# Patient Record
Sex: Male | Born: 1960
Health system: Southern US, Community
[De-identification: ages and names within clinical notes are randomized; demographics above are authoritative.]

## PROBLEM LIST (undated history)

## (undated) DIAGNOSIS — E7800 Pure hypercholesterolemia, unspecified: Secondary | ICD-10-CM

---

## 2015-04-14 ENCOUNTER — Emergency Department
Admission: EM | Admit: 2015-04-14 | Discharge: 2015-04-14 | Disposition: A | Discharge status: Home / Self Care | Payer: Managed Care, Other (non HMO) | Attending: Emergency Medicine | Admitting: Emergency Medicine

## 2015-04-14 ENCOUNTER — Encounter: Payer: Self-pay | Admitting: Emergency Medicine

## 2015-04-14 ENCOUNTER — Emergency Department: Payer: Managed Care, Other (non HMO)

## 2015-04-14 DIAGNOSIS — IMO0002 Reserved for concepts with insufficient information to code with codable children: Secondary | ICD-10-CM

## 2015-04-14 DIAGNOSIS — Z87891 Personal history of nicotine dependence: Secondary | ICD-10-CM | POA: Diagnosis not present

## 2015-04-14 DIAGNOSIS — W540XXA Bitten by dog, initial encounter: Secondary | ICD-10-CM | POA: Diagnosis not present

## 2015-04-14 DIAGNOSIS — Y929 Unspecified place or not applicable: Secondary | ICD-10-CM | POA: Insufficient documentation

## 2015-04-14 DIAGNOSIS — S61011A Laceration without foreign body of right thumb without damage to nail, initial encounter: Secondary | ICD-10-CM | POA: Insufficient documentation

## 2015-04-14 DIAGNOSIS — Y999 Unspecified external cause status: Secondary | ICD-10-CM | POA: Diagnosis not present

## 2015-04-14 DIAGNOSIS — Z23 Encounter for immunization: Secondary | ICD-10-CM | POA: Diagnosis not present

## 2015-04-14 DIAGNOSIS — Y9301 Activity, walking, marching and hiking: Secondary | ICD-10-CM | POA: Diagnosis not present

## 2015-04-14 DIAGNOSIS — S61051A Open bite of right thumb without damage to nail, initial encounter: Secondary | ICD-10-CM | POA: Diagnosis present

## 2015-04-14 DIAGNOSIS — S61411A Laceration without foreign body of right hand, initial encounter: Secondary | ICD-10-CM | POA: Insufficient documentation

## 2015-04-14 MED ORDER — LIDOCAINE-EPINEPHRINE 2 %-1:100000 IJ SOLN: 20.0000 mL | Freq: Once | INTRAMUSCULAR | Status: DC

## 2015-04-14 MED ORDER — LIDOCAINE-EPINEPHRINE (PF) 2 %-1:200000 IJ SOLN: 10.0000 mL | Freq: Once | INTRAMUSCULAR | Status: DC

## 2015-04-14 MED ORDER — OXYCODONE-ACETAMINOPHEN 5-325 MG PO TABS: 2.0000 | ORAL_TABLET | Freq: Four times a day (QID) | ORAL | Status: AC | PRN

## 2015-04-14 MED ORDER — CEPHALEXIN 500 MG PO CAPS
500.0000 mg | ORAL_CAPSULE | Freq: Four times a day (QID) | ORAL | Status: AC
Start: 2015-04-14 — End: 2015-04-24

## 2015-04-14 MED ORDER — TETANUS-DIPHTH-ACELL PERTUSSIS 5-2.5-18.5 LF-MCG/0.5 IM SUSP
0.5000 mL | Freq: Once | INTRAMUSCULAR | Status: AC
  Administered 2015-04-14: 0.5 mL via INTRAMUSCULAR
  Filled 2015-04-14: qty 0.5

## 2015-04-14 MED ORDER — OXYCODONE-ACETAMINOPHEN 5-325 MG PO TABS
ORAL_TABLET | ORAL | Status: AC
  Filled 2015-04-14: qty 2

## 2015-04-14 MED ORDER — OXYCODONE-ACETAMINOPHEN 5-325 MG PO TABS
2.0000 | ORAL_TABLET | Freq: Once | ORAL | Status: AC
  Administered 2015-04-14: 2 via ORAL

## 2015-04-14 NOTE — ED Notes (Signed)
MD at bedside. 

## 2015-04-14 NOTE — ED Notes (Signed)
Bit by neighbors dog on rt hand - vasovagaled once home and wife called 35. Pt feels fine now. Dogs up to date on shots.

## 2015-04-14 NOTE — Discharge Instructions (Signed)
Laceration Care, Adult A laceration is a cut that goes through all of the layers of the skin and into the tissue that is right under the skin. Some lacerations heal on their own. Others need to be closed with stitches (sutures), staples, skin adhesive strips, or skin glue. Proper laceration care minimizes the risk of infection and helps the laceration to heal better. HOW TO CARE FOR YOUR LACERATION If sutures or staples were used:  Keep the wound clean and dry.  If you were given a bandage (dressing), you should change it at least one time per day or as told by your health care provider. You should also change it if it becomes wet or dirty.  Keep the wound completely dry for the first 24 hours or as told by your health care provider. After that time, you may shower or bathe. However, make sure that the wound is not soaked in water until after the sutures or staples have been removed.  Clean the wound one time each day or as told by your health care provider:  Wash the wound with soap and water.  Rinse the wound with water to remove all soap.  Pat the wound dry with a clean towel. Do not rub the wound.  After cleaning the wound, apply a thin layer of antibiotic ointmentas told by your health care provider. This will help to prevent infection and keep the dressing from sticking to the wound.  Have the sutures or staples removed as told by your health care provider. If skin adhesive strips were used:  Keep the wound clean and dry.  If you were given a bandage (dressing), you should change it at least one time per day or as told by your health care provider. You should also change it if it becomes dirty or wet.  Do not get the skin adhesive strips wet. You may shower or bathe, but be careful to keep the wound dry.  If the wound gets wet, pat it dry with a clean towel. Do not rub the wound.  Skin adhesive strips fall off on their own. You may trim the strips as the wound heals. Do not  remove skin adhesive strips that are still stuck to the wound. They will fall off in time. If skin glue was used:  Try to keep the wound dry, but you may briefly wet it in the shower or bath. Do not soak the wound in water, such as by swimming.  After you have showered or bathed, gently pat the wound dry with a clean towel. Do not rub the wound.  Do not do any activities that will make you sweat heavily until the skin glue has fallen off on its own.  Do not apply liquid, cream, or ointment medicine to the wound while the skin glue is in place. Using those may loosen the film before the wound has healed.  If you were given a bandage (dressing), you should change it at least one time per day or as told by your health care provider. You should also change it if it becomes dirty or wet.  If a dressing is placed over the wound, be careful not to apply tape directly over the skin glue. Doing that may cause the glue to be pulled off before the wound has healed.  Do not pick at the glue. The skin glue usually remains in place for 5-10 days, then it falls off of the skin. General Instructions  Take over-the-counter and prescription  medicines only as told by your health care provider.  If you were prescribed an antibiotic medicine or ointment, take or apply it as told by your doctor. Do not stop using it even if your condition improves.  To help prevent scarring, make sure to cover your wound with sunscreen whenever you are outside after stitches are removed, after adhesive strips are removed, or when glue remains in place and the wound is healed. Make sure to wear a sunscreen of at least 30 SPF.  Do not scratch or pick at the wound.  Keep all follow-up visits as told by your health care provider. This is important.  Check your wound every day for signs of infection. Watch for:  Redness, swelling, or pain.  Fluid, blood, or pus.  Raise (elevate) the injured area above the level of your heart  while you are sitting or lying down, if possible. SEEK MEDICAL CARE IF:  You received a tetanus shot and you have swelling, severe pain, redness, or bleeding at the injection site.  You have a fever.  A wound that was closed breaks open.  You notice a bad smell coming from your wound or your dressing.  You notice something coming out of the wound, such as wood or glass.  Your pain is not controlled with medicine.  You have increased redness, swelling, or pain at the site of your wound.  You have fluid, blood, or pus coming from your wound.  You notice a change in the color of your skin near your wound.  You need to change the dressing frequently due to fluid, blood, or pus draining from the wound.  You develop a new rash.  You develop numbness around the wound. SEEK IMMEDIATE MEDICAL CARE IF:  You develop severe swelling around the wound.  Your pain suddenly increases and is severe.  You develop painful lumps near the wound or on skin that is anywhere on your body.  You have a red streak going away from your wound.  The wound is on your hand or foot and you cannot properly move a finger or toe.  The wound is on your hand or foot and you notice that your fingers or toes look pale or bluish.   This information is not intended to replace advice given to you by your health care provider. Make sure you discuss any questions you have with your health care provider.   Document Released: 12/22/2004 Document Revised: 05/08/2014 Document Reviewed: 12/18/2013 Elsevier Interactive Patient Education 2016 [employer]. Conservator, museum/gallery bites can range from mild to serious. An animal bite can result in a scratch on the skin, a deep open cut, a puncture of the skin, a crush injury, or tearing away of the skin or a body part. A small bite from a house pet will usually not cause serious problems. However, some animal bites can become infected or injure a bone or other tissue.  Bites  from certain animals can be more dangerous because of the risk of spreading rabies, which is a serious viral infection. This risk is higher with bites from stray animals or wild animals, such as raccoons, foxes, skunks, and bats. Dogs are responsible for most animal bites. Children are bitten more often than adults. SYMPTOMS  Common symptoms of an animal bite include:   Pain.   Bleeding.   Swelling.   Bruising.  DIAGNOSIS  This condition may be diagnosed based on a physical exam and medical history. Your health care provider will  examine the wound and ask for details about the animal and how the bite happened. You may also have tests, such as:   Blood tests to check for infection or to determine if surgery is needed.  X-rays to check for damage to bones or joints.  Culture test. This uses a sample of fluid from the wound to check for infection. TREATMENT  Treatment varies depending on the location and type of animal bite and your medical history. Treatment may include:   Wound care. This often includes cleaning the wound, flushing the wound with saline solution, and applying a bandage (dressing). Sometimes, the wound is left open to heal because of the high risk of infection. However, in some cases, the wound may be closed with stitches (sutures), staples, skin glue, or adhesive strips.   Antibiotic medicine.   Tetanus shot.   Rabies treatment if the animal could have rabies.  In some cases, bites that have become infected may require IV antibiotics and surgical treatment in the hospital.  [place]  Follow instructions from your health care provider about how to take care of your wound. Make sure you:  Wash your hands with soap and water before you change your dressing. If soap and water are not available, use hand sanitizer.  Change your dressing as told by your health care provider.  Leave sutures, skin glue, or adhesive strips in place.  These skin closures may need to be in place for 2 weeks or longer. If adhesive strip edges start to loosen and curl up, you may trim the loose edges. Do not remove adhesive strips completely unless your health care provider tells you to do that.  Check your wound every day for signs of infection. Watch for:   Increasing redness, swelling, or pain.   Fluid, blood, or pus.  General Instructions  Take or apply over-the-counter and prescription medicines only as told by your health care provider.   If you were prescribed an antibiotic, take or apply it as told by your health care provider. Do not stop using the antibiotic even if your condition improves.   Keep the injured area raised (elevated) above the level of your heart while you are sitting or lying down, if this is possible.   If directed, apply ice to the injured area.   Put ice in a plastic bag.   Place a towel between your skin and the bag.   Leave the ice on for 20 minutes, 2-3 times per day.   Keep all follow-up visits as told by your health care provider. This is important.  SEEK MEDICAL CARE IF:  You have increasing redness, swelling, or pain at the site of your wound.   You have a general feeling of sickness (malaise).   You feel nauseous or you vomit.   You have pain that does not get better.  SEEK IMMEDIATE MEDICAL CARE IF:  You have a red streak extending away from your wound.   You have fluid, blood, or pus coming from your wound.   You have a fever or chills.   You have trouble moving your injured area.   You have numbness or tingling extending beyond the wound.   This information is not intended to replace advice given to you by your health care provider. Make sure you discuss any questions you have with your health care provider.   Document Released: 09/09/2010 Document Revised: 09/12/2014 Document Reviewed: 05/09/2014 Elsevier Interactive Patient Education 2016 Elsevier  Inc. ° °

## 2015-04-14 NOTE — ED Provider Notes (Signed)
[facility] Emergency Department Provider Note     Time seen: ----------------------------------------- 7:39 PM on 04/14/2015 -----------------------------------------    I have reviewed the triage vital signs and the nursing notes.   HISTORY  Chief Complaint Animal Bite    HPI Barry Garcia is a 55 y.o. male who presents to ER after he was bitten on his right hand by a neighbors dog. Patient states he was walking his dog and his neighbor's dog came out to meet them and started fighting with his dog. He tried to break it up and the dog bit him in the right hand. Patient got weak and lightheaded at home after this event, currently feels fine. He is complaining of some right hand soreness. The dog shots have been up-to-date.   History reviewed. No pertinent past medical history.  There are no active problems to display for this patient.   History reviewed. No pertinent past surgical history.  Allergies Review of patient's allergies indicates no known allergies.  Social History Social History  Substance Use Topics  . Smoking status: Former Research scientist (life sciences)  . Smokeless tobacco: None  . Alcohol Use: No     Comment: now vapes 0 nicotine    Review of Systems Constitutional: Negative for fever. Skin: Positive for puncture wounds and lacerations in the right hand Neurological: Negative for headaches, focal weakness or numbness.  ____________________________________________   PHYSICAL EXAM:  VITAL SIGNS: ED Triage Vitals  Enc Vitals Group     BP 04/14/15 1924 104/67 mmHg     Pulse Rate 04/14/15 1924 85     Resp 04/14/15 1924 18     Temp 04/14/15 1924 98.3 F (36.8 C)     Temp Source 04/14/15 1924 Oral     SpO2 04/14/15 1924 97 %     Weight 04/14/15 1924 250 lb (113.399 kg)     Height 04/14/15 1924 6' (1.829 m)     Head Cir --      Peak Flow --      Pain Score 04/14/15 1930 3     Pain Loc --      Pain Edu? --      Excl. in [place]? --      Constitutional: Alert and oriented. Well appearing and in no distress. Eyes: Conjunctivae are normal. Normal extraocular movements. Musculoskeletal: Nontender with normal range of motion in all extremities. Normal range of motion of the hand Neurologic:  Normal speech and language. No gross focal neurologic deficits are appreciated.  Skin:  Multiple puncture wounds and lacerations and noted around the base of the right thumb, over the anterior and posterior aspect. Psychiatric: Mood and affect are normal.  ____________________________________________  ED COURSE:  Pertinent labs & imaging results that were available during my care of the patient were reviewed by me and considered in my medical decision making (see chart for details). We will obtain x-rays of the right hand, given a TDAP. LACERATION REPAIR Performed by: Earleen Newport Authorized by: Lenise Arena E Consent: Verbal consent obtained. Risks and benefits: risks, benefits and alternatives were discussed Consent given by: patient Patient identity confirmed: provided demographic data Prepped and Draped in normal sterile fashion Wound explored  Laceration Location: Right hand  Laceration Length: 1.5 cm, 1.5 cm, 1.5 cm  No Foreign Bodies seen or palpated  Anesthesia: local infiltration  Local anesthetic: lidocaine 1 % with epinephrine  Anesthetic total: 3 ml  Irrigation method: syringe Amount of cleaning: standard  Skin closure: 5-0 Ethilon  Number of sutures: 3   Technique: Simple interrupted   Patient tolerance: Patient tolerated the procedure well with no immediate complications. ____________________________________________    RADIOLOGY  Right hand x-rays  Are unremarkable ____________________________________________  FINAL ASSESSMENT AND PLAN  Dog Bite, laceration repair  Plan: Patient with imaging as dictated above. Patient presents after a dog bite, dog's vaccinations are  up-to-date. I have loosely reapproximated the lacerations around his right thumb. He has normal tendon, normal sensory and motor function. I advise suture removal in 10 days.   Earleen Newport, MD   Earleen Newport, MD 04/14/15 2033

## 2015-09-04 ENCOUNTER — Encounter: Payer: Self-pay | Admitting: *Deleted

## 2015-09-05 ENCOUNTER — Encounter
Admission: RE | Disposition: A | Discharge status: Home / Self Care | Payer: Self-pay | Source: Ambulatory Visit | Attending: Unknown Physician Specialty

## 2015-09-05 ENCOUNTER — Ambulatory Visit
Admission: RE | Admit: 2015-09-05 | Discharge: 2015-09-05 | Disposition: A | Discharge status: Home / Self Care | Payer: Managed Care, Other (non HMO) | Source: Ambulatory Visit | Attending: Unknown Physician Specialty | Admitting: Unknown Physician Specialty

## 2015-09-05 ENCOUNTER — Encounter: Payer: Self-pay | Admitting: *Deleted

## 2015-09-05 ENCOUNTER — Ambulatory Visit: Payer: Managed Care, Other (non HMO) | Admitting: Anesthesiology

## 2015-09-05 DIAGNOSIS — Z87891 Personal history of nicotine dependence: Secondary | ICD-10-CM | POA: Diagnosis not present

## 2015-09-05 DIAGNOSIS — D125 Benign neoplasm of sigmoid colon: Secondary | ICD-10-CM | POA: Diagnosis not present

## 2015-09-05 DIAGNOSIS — Z79899 Other long term (current) drug therapy: Secondary | ICD-10-CM | POA: Insufficient documentation

## 2015-09-05 DIAGNOSIS — Z1211 Encounter for screening for malignant neoplasm of colon: Secondary | ICD-10-CM | POA: Diagnosis not present

## 2015-09-05 DIAGNOSIS — K621 Rectal polyp: Secondary | ICD-10-CM | POA: Insufficient documentation

## 2015-09-05 DIAGNOSIS — K640 First degree hemorrhoids: Secondary | ICD-10-CM | POA: Diagnosis not present

## 2015-09-05 DIAGNOSIS — Z86010 Personal history of colonic polyps: Secondary | ICD-10-CM | POA: Diagnosis present

## 2015-09-05 DIAGNOSIS — E7800 Pure hypercholesterolemia, unspecified: Secondary | ICD-10-CM | POA: Diagnosis not present

## 2015-09-05 HISTORY — PX: COLONOSCOPY WITH PROPOFOL: SHX5780

## 2015-09-05 HISTORY — DX: Pure hypercholesterolemia, unspecified: E78.00

## 2015-09-05 SURGERY — COLONOSCOPY WITH PROPOFOL
Anesthesia: General

## 2015-09-05 MED ORDER — PROPOFOL 500 MG/50ML IV EMUL
INTRAVENOUS | Status: DC | PRN
  Administered 2015-09-05: 150 ug/kg/min via INTRAVENOUS

## 2015-09-05 MED ORDER — PROPOFOL 10 MG/ML IV BOLUS
INTRAVENOUS | Status: DC | PRN
  Administered 2015-09-05: 50 mg via INTRAVENOUS

## 2015-09-05 MED ORDER — MIDAZOLAM HCL 2 MG/2ML IJ SOLN
INTRAMUSCULAR | Status: DC | PRN
  Administered 2015-09-05: 2 mg via INTRAVENOUS

## 2015-09-05 MED ORDER — SODIUM CHLORIDE 0.9 % IV SOLN: INTRAVENOUS | Status: DC

## 2015-09-05 MED ORDER — LIDOCAINE HCL (CARDIAC) 20 MG/ML IV SOLN
INTRAVENOUS | Status: DC | PRN
Start: 2015-09-05 — End: 2015-09-05
  Administered 2015-09-05: 60 mg via INTRAVENOUS

## 2015-09-05 MED ORDER — SODIUM CHLORIDE 0.9 % IV SOLN
INTRAVENOUS | Status: DC
  Administered 2015-09-05: 15:00:00 via INTRAVENOUS

## 2015-09-05 NOTE — H&P (Signed)
   Primary Care Physician:  No PCP Per Patient Primary Gastroenterologist:  Dr. Vira Agar  Pre-Procedure History & Physical: HPI:  Barry Garcia is a 55 y.o. male is here for an colonoscopy.   Past Medical History:  Diagnosis Date  . High cholesterol     History reviewed. No pertinent surgical history.  Prior to Admission medications   Medication Sig Start Date End Date Taking? Authorizing Provider  lovastatin (MEVACOR) 20 MG tablet Take 20 mg by mouth at bedtime.   Yes Historical Provider, MD  oxyCODONE-acetaminophen (PERCOCET) 5-325 MG tablet Take 2 tablets by mouth every 6 (six) hours as needed for moderate pain or severe pain. Patient not taking: Reported on 09/05/2015 04/14/15   Earleen Newport, MD    Allergies as of 09/03/2015  . (No Known Allergies)    History reviewed. No pertinent family history.  Social History   Social History  . Marital status: Married    Spouse name: N/A  . Number of children: N/A  . Years of education: N/A   Occupational History  . Not on file.   Social History Main Topics  . Smoking status: Former Research scientist (life sciences)  . Smokeless tobacco: Never Used  . Alcohol use No     Comment: now vapes 0 nicotine  . Drug use: No  . Sexual activity: Not on file   Other Topics Concern  . Not on file   Social History Narrative  . No narrative on file    Review of Systems: See HPI, otherwise negative ROS  Physical Exam: BP 138/87   Pulse 79   Temp 98.2 F (36.8 C) (Tympanic)   Resp 19   Ht 6' (1.829 m)   Wt 108.9 kg (240 lb)   SpO2 98%   BMI 32.55 kg/m  General:   Alert,  pleasant and cooperative in NAD Head:  Normocephalic and atraumatic. Neck:  Supple; no masses or thyromegaly. Lungs:  Clear throughout to auscultation.    Heart:  Regular rate and rhythm. Abdomen:  Soft, nontender and nondistended. Normal bowel sounds, without guarding, and without rebound.   Neurologic:  Alert and  oriented x4;  grossly normal  neurologically.  Impression/Plan: Deakin Affeldt is here for an colonoscopy to be performed for [facility] colon polyps  Risks, benefits, limitations, and alternatives regarding  colonoscopy have been reviewed with the patient.  Questions have been answered.  All parties agreeable.   Gaylyn Cheers, MD  09/05/2015, 2:34 PM

## 2015-09-05 NOTE — Op Note (Signed)
[facility] Gastroenterology Patient Name: Barry Garcia Procedure Date: 09/05/2015 2:33 PM MRN: [id number] Account #: [id number] Date of Birth: [birth year] Admit Type: Outpatient Age: 55 Room: [facility] ENDO ROOM 3 Gender: Male Note Status: Finalized Procedure:            Colonoscopy Indications:          High risk colon cancer surveillance: Personal history                        of colonic polyps Providers:            Manya Silvas, MD Referring MD:         Dion Body (Referring MD) Medicines:            Propofol per Anesthesia Complications:        No immediate complications. Procedure:            Pre-Anesthesia Assessment:                       - After reviewing the risks and benefits, the patient                        was deemed in satisfactory condition to undergo the                        procedure.                       After obtaining informed consent, the colonoscope was                        passed under direct vision. Throughout the procedure,                        the patient's blood pressure, pulse, and oxygen                        saturations were monitored continuously. The                        Colonoscope was introduced through the anus and                        advanced to the the cecum, identified by appendiceal                        orifice and ileocecal valve. The colonoscopy was                        performed without difficulty. The patient tolerated the                        procedure well. The quality of the bowel preparation                        was excellent. Findings:      Three sessile polyps were found in the rectum and sigmoid colon. The       polyps were diminutive in size. These polyps were removed with a jumbo       cold forceps. Resection and retrieval were complete.      Internal hemorrhoids  were found during endoscopy. The hemorrhoids were       small and Grade I (internal hemorrhoids that do not  prolapse).      The exam was otherwise without abnormality. Impression:           - Three diminutive polyps in the rectum and in the                        sigmoid colon, removed with a jumbo cold forceps.                        Resected and retrieved.                       - Internal hemorrhoids.                       - The examination was otherwise normal. Recommendation:       - Await pathology results. Manya Silvas, MD 09/05/2015 2:57:12 PM This report has been signed electronically. Number of Addenda: 0 Note Initiated On: 09/05/2015 2:33 PM Scope Withdrawal Time: 0 hours 11 minutes 1 second  Total Procedure Duration: 0 hours 15 minutes 7 seconds       [facility]

## 2015-09-05 NOTE — Transfer of Care (Signed)
Immediate Anesthesia Transfer of Care Note  Patient: Barry Garcia  Procedure(s) Performed: Procedure(s): COLONOSCOPY WITH PROPOFOL (N/A)  Patient Location: PACU  Anesthesia Type:General  Level of Consciousness: sedated  Airway & Oxygen Therapy: Patient Spontanous Breathing and Patient connected to nasal cannula oxygen  Post-op Assessment: Report given to RN and Post -op Vital signs reviewed and stable  Post vital signs: Reviewed and stable  Last Vitals:  Vitals:   09/05/15 1323 09/05/15 1503  BP: 138/87 (P) 117/84  Pulse: 79 71  Resp: 19 18  Temp: 36.8 C (!) (P) 35.9 C    Last Pain:  Vitals:   09/05/15 1503  TempSrc: (P) Tympanic         Complications: No apparent anesthesia complications

## 2015-09-05 NOTE — Anesthesia Preprocedure Evaluation (Signed)
Anesthesia Evaluation  Patient identified by MRN, date of birth, ID band Patient awake    Reviewed: Allergy & Precautions, H&P , NPO status , Patient's Chart, lab work & pertinent test results, reviewed documented beta blocker date and time   History of Anesthesia Complications Negative for: history of anesthetic complications  Airway Mallampati: III  TM Distance: >3 FB Neck ROM: full    Dental no notable dental hx. (+) Teeth Intact   Pulmonary neg pulmonary ROS, former smoker,    Pulmonary exam normal breath sounds clear to auscultation       Cardiovascular Exercise Tolerance: Good negative cardio ROS Normal cardiovascular exam Rhythm:regular Rate:Normal     Neuro/Psych negative neurological ROS  negative psych ROS   GI/Hepatic negative GI ROS, Neg liver ROS,   Endo/Other  negative endocrine ROS  Renal/GU negative Renal ROS  negative genitourinary   Musculoskeletal   Abdominal   Peds  Hematology negative hematology ROS (+)   Anesthesia Other Findings Past Medical History: No date: High cholesterol   Reproductive/Obstetrics negative OB ROS                             Anesthesia Physical Anesthesia Plan  ASA: II  Anesthesia Plan: General   Post-op Pain Management:    Induction:   Airway Management Planned:   Additional Equipment:   Intra-op Plan:   Post-operative Plan:   Informed Consent: I have reviewed the patients History and Physical, chart, labs and discussed the procedure including the risks, benefits and alternatives for the proposed anesthesia with the patient or authorized representative who has indicated his/her understanding and acceptance.   Dental Advisory Given  Plan Discussed with: Anesthesiologist, CRNA and Surgeon  Anesthesia Plan Comments:         Anesthesia Quick Evaluation

## 2015-09-10 LAB — SURGICAL PATHOLOGY

## 2015-09-10 NOTE — Anesthesia Postprocedure Evaluation (Signed)
Anesthesia Post Note  Patient: Barry Garcia  Procedure(s) Performed: Procedure(s) (LRB): COLONOSCOPY WITH PROPOFOL (N/A)  Patient location during evaluation: Endoscopy Anesthesia Type: General Level of consciousness: awake and alert Pain management: pain level controlled Vital Signs Assessment: post-procedure vital signs reviewed and stable Respiratory status: spontaneous breathing and respiratory function stable Cardiovascular status: stable Anesthetic complications: no    Last Vitals:  Vitals:   09/05/15 1520 09/05/15 1530  BP: 104/80   Pulse: 75   Resp: 15 16  Temp:      Last Pain:  Vitals:   09/06/15 0739  TempSrc:   PainSc: 0-No pain                 Ilaisaane Marts K

## 2015-09-19 ENCOUNTER — Encounter: Payer: Self-pay | Admitting: Unknown Physician Specialty

## 2017-03-25 IMAGING — CR DG HAND COMPLETE 3+V*R*
1 series · 3 of 3 positions shown · non-contrast
Comparison: None.

CLINICAL DATA: Recent dog bite with pain, initial encounter

EXAM:
RIGHT HAND - COMPLETE 3+ VIEW

[Series 1: dg hand complete right · 0.14mm/px · 3 of 3 slices shown]
[im 1/3]
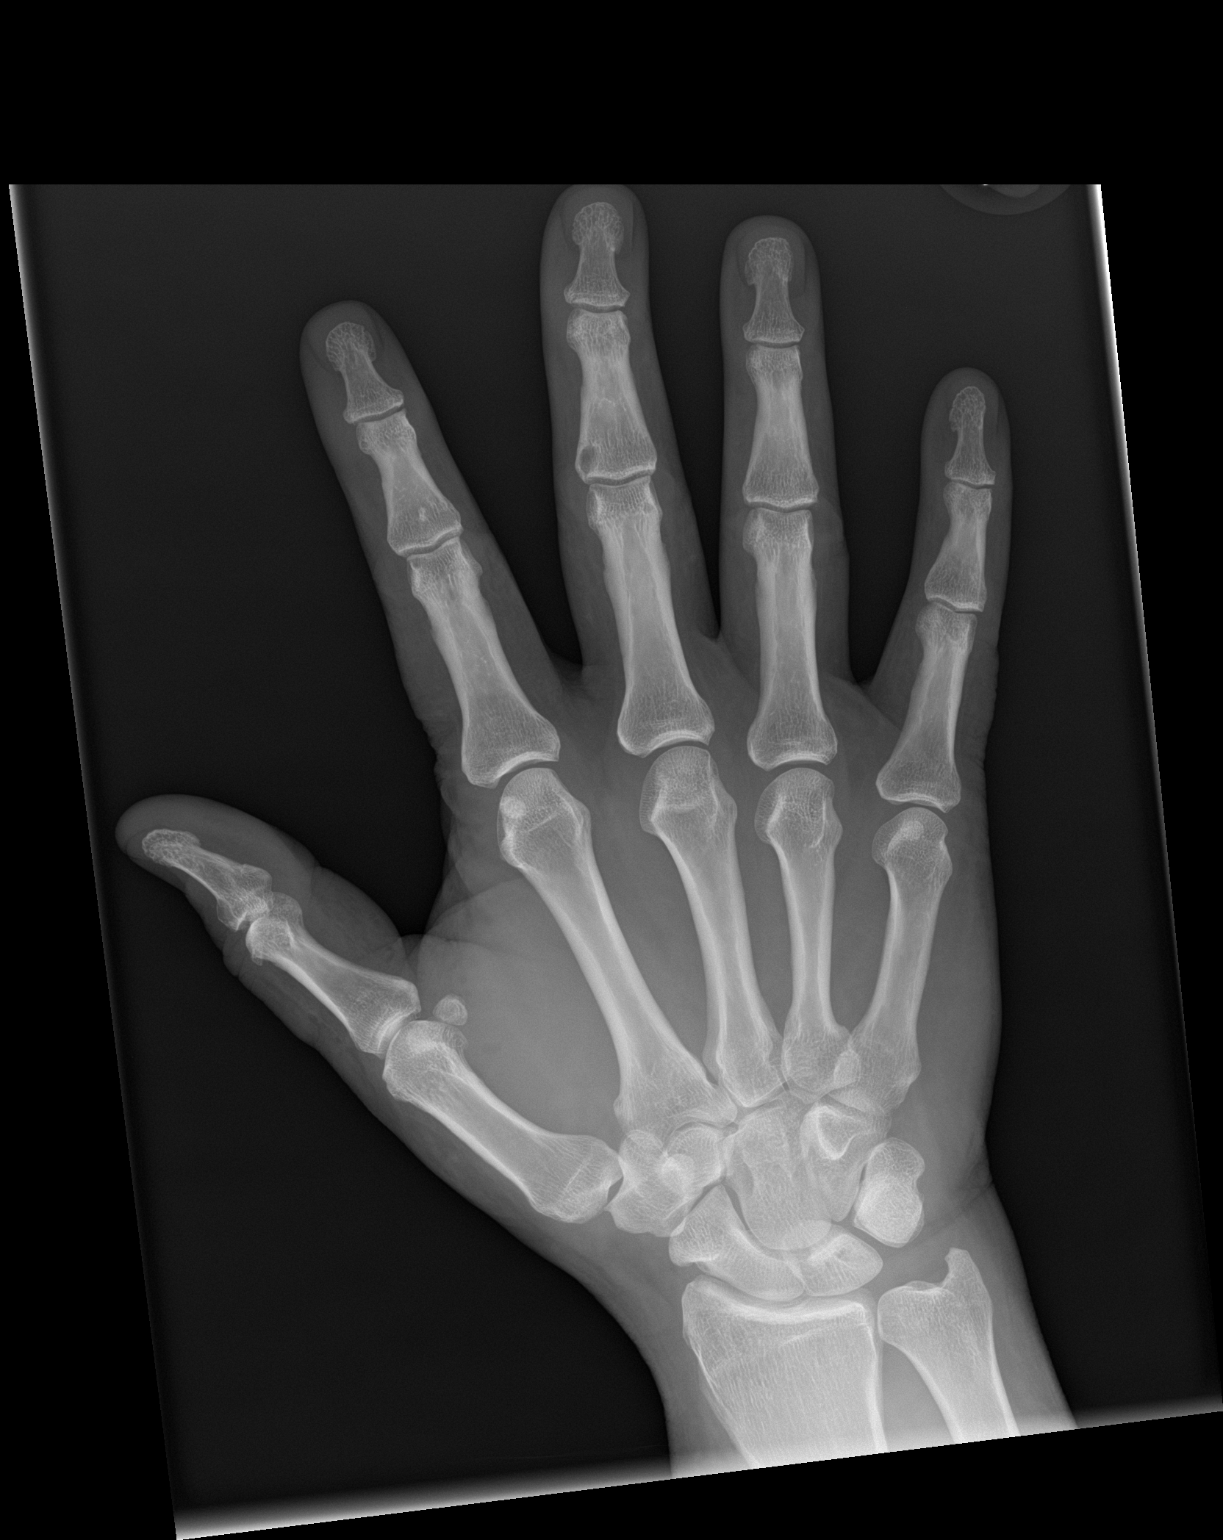
[im 2/3]
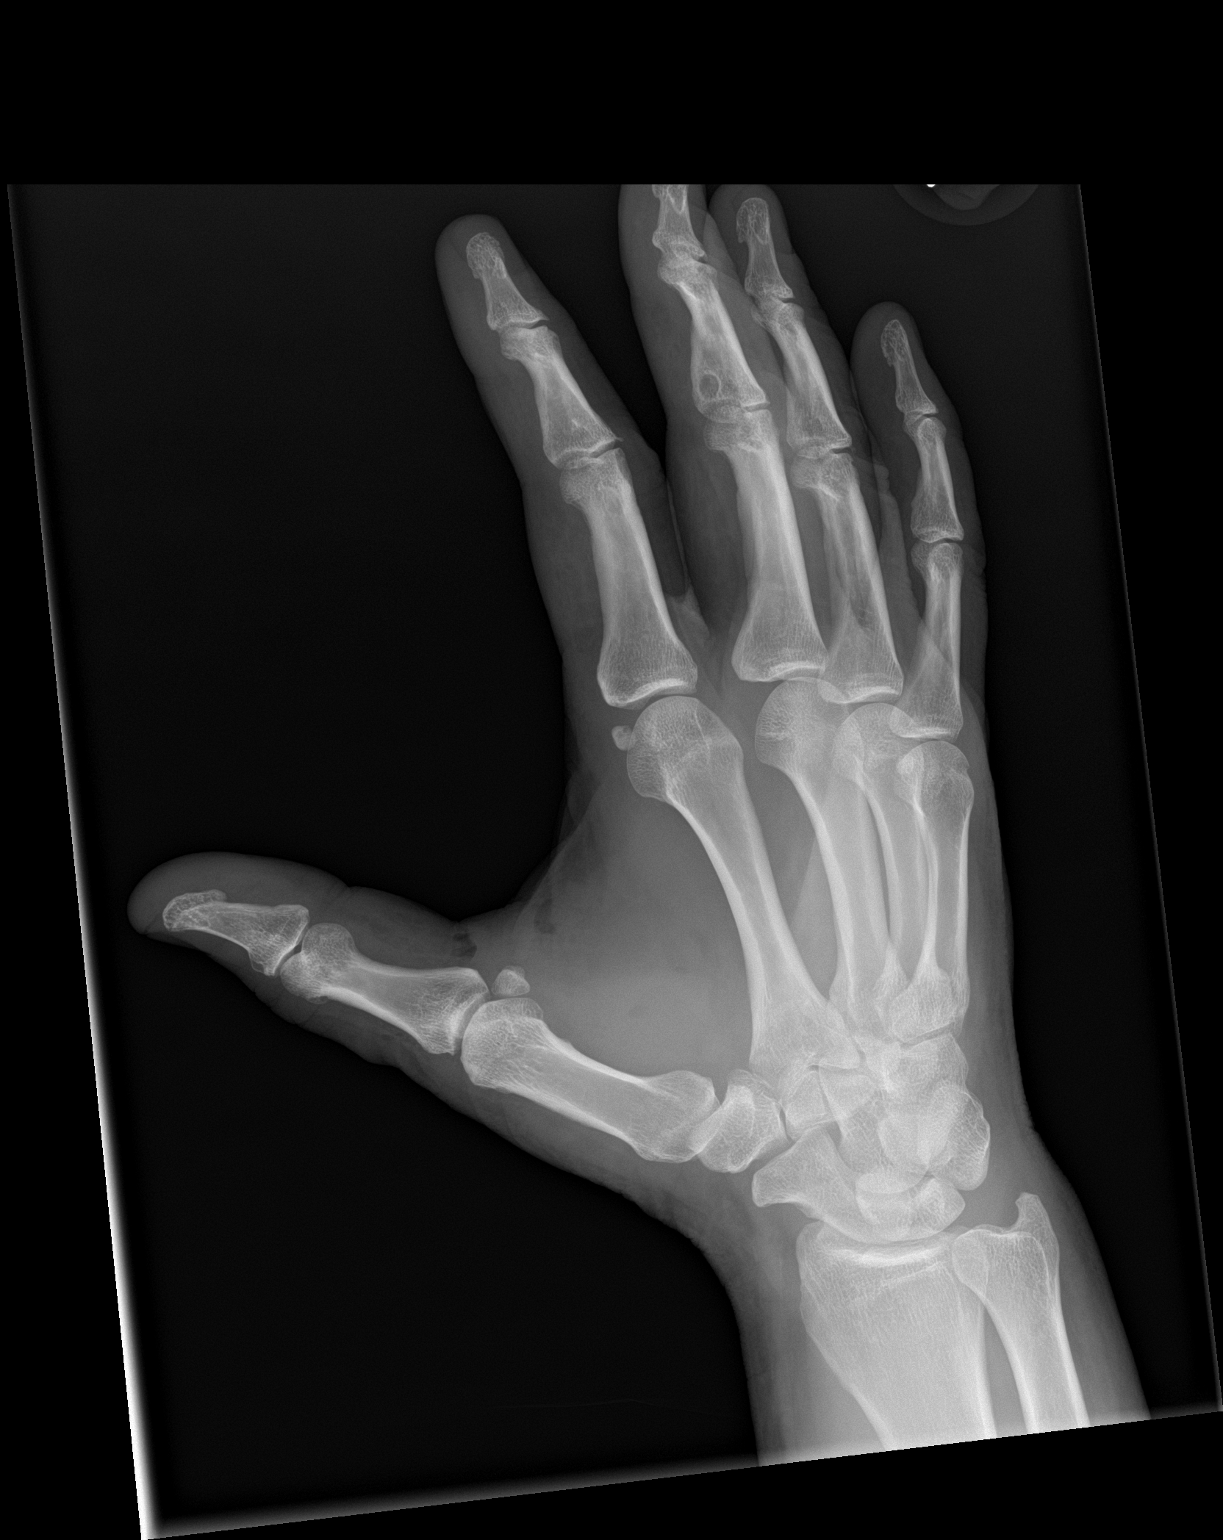
[im 3/3]
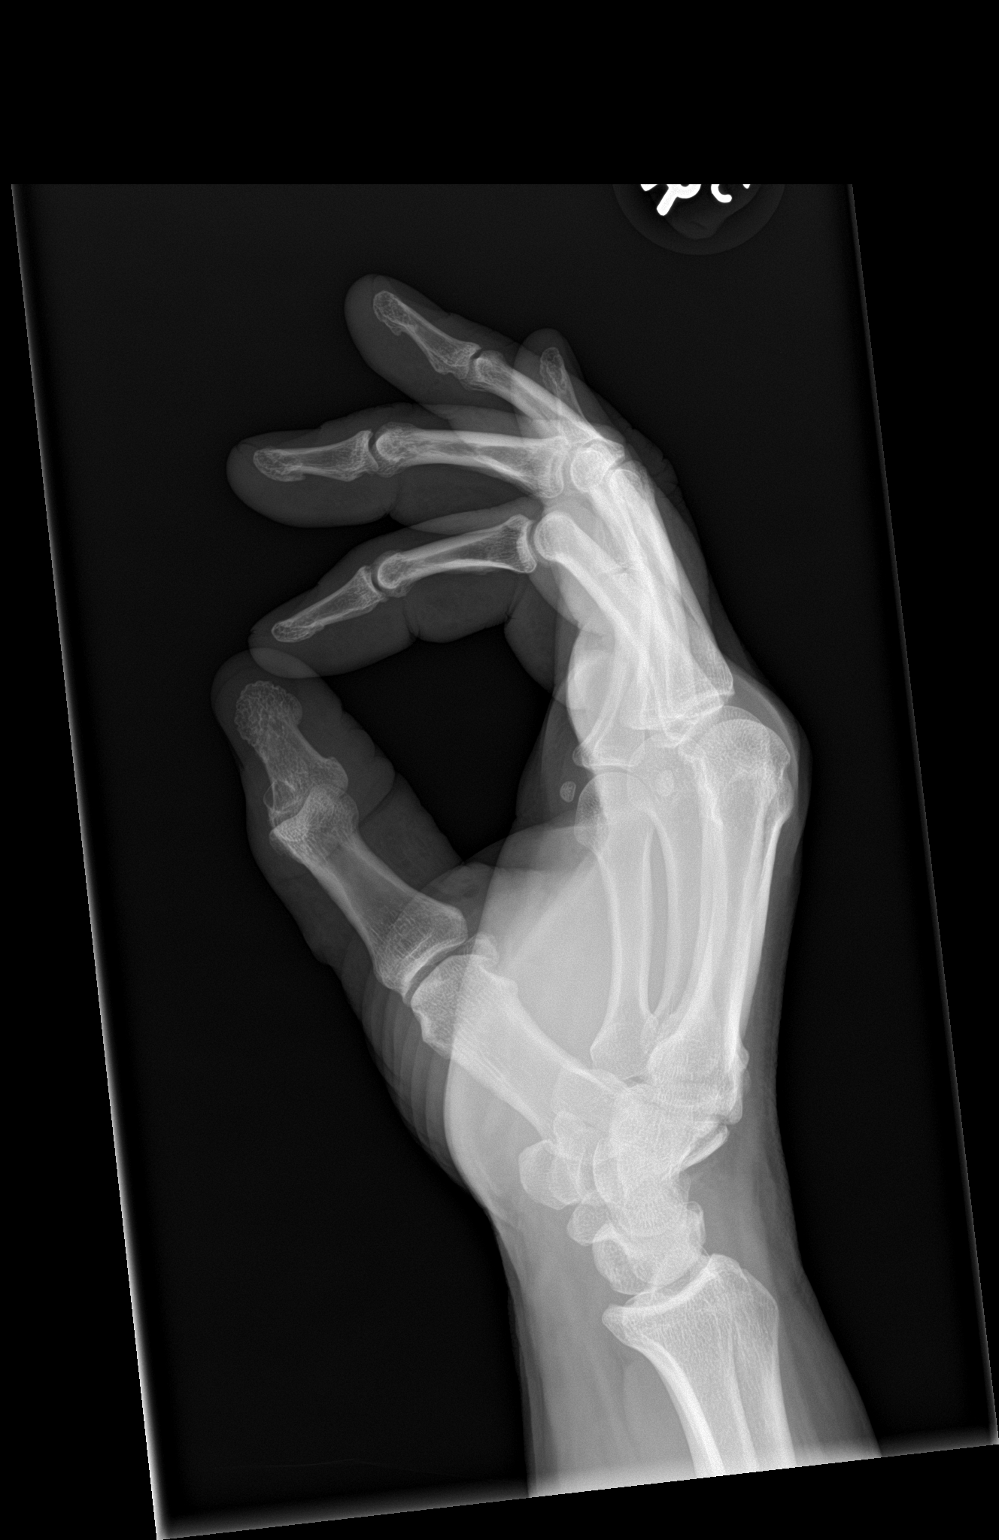

[3 of 3 positions shown; findings below may reference images not displayed]

FINDINGS: Soft tissue abnormality is noted between the first and second
metacarpals consistent with the recent injury. No acute bony
abnormality is seen. A cyst is noted within the third middle
phalanx.
IMPRESSION: No acute bony abnormality is seen.  Soft tissue injury is noted.

## 2018-01-13 DIAGNOSIS — R7303 Prediabetes: Secondary | ICD-10-CM | POA: Diagnosis not present

## 2018-01-13 DIAGNOSIS — E7800 Pure hypercholesterolemia, unspecified: Secondary | ICD-10-CM | POA: Diagnosis not present

## 2018-01-20 DIAGNOSIS — E7800 Pure hypercholesterolemia, unspecified: Secondary | ICD-10-CM | POA: Diagnosis not present

## 2018-01-20 DIAGNOSIS — E6609 Other obesity due to excess calories: Secondary | ICD-10-CM | POA: Diagnosis not present

## 2018-01-20 DIAGNOSIS — R7303 Prediabetes: Secondary | ICD-10-CM | POA: Diagnosis not present

## 2019-04-21 ENCOUNTER — Telehealth: Payer: Self-pay | Admitting: *Deleted

## 2019-04-21 DIAGNOSIS — Z87891 Personal history of nicotine dependence: Secondary | ICD-10-CM

## 2019-04-21 NOTE — Telephone Encounter (Signed)
Received referral for initial lung cancer screening scan. Contacted patient and obtained smoking history,(former, quit 2012, 45 pack year) as well as answering questions related to screening process. Patient denies signs of lung cancer such as weight loss or hemoptysis. Patient denies comorbidity that would prevent curative treatment if lung cancer were found. Patient is scheduled for shared decision making visit 04/26/19 and CT 04/27/19 at 8am.

## 2019-04-26 ENCOUNTER — Inpatient Hospital Stay: Payer: Commercial Managed Care - PPO | Attending: Oncology | Admitting: Hospice and Palliative Medicine

## 2019-04-26 DIAGNOSIS — Z87891 Personal history of nicotine dependence: Secondary | ICD-10-CM

## 2019-04-26 NOTE — Progress Notes (Signed)
In accordance with CMS guidelines, patient has met eligibility criteria including age, absence of signs or symptoms of lung cancer.  Social History   Tobacco Use  . Smoking status: Former Research scientist (life sciences)  . Smokeless tobacco: Never Used  Substance Use Topics  . Alcohol use: No    Comment: now vapes 0 nicotine  . Drug use: No      A shared decision-making session was conducted prior to the performance of CT scan. This includes one or more decision aids, includes benefits and harms of screening, follow-up diagnostic testing, over-diagnosis, false positive rate, and total radiation exposure.   Counseling on the importance of adherence to annual lung cancer LDCT screening, impact of co-morbidities, and ability or willingness to undergo diagnosis and treatment is imperative for compliance of the program.   Counseling on the importance of continued smoking cessation for former smokers; the importance of smoking cessation for current smokers, and information about tobacco cessation interventions have been given to patient including [place] and [facility] quit [place] programs.   Written order for lung cancer screening with LDCT has been given to the patient and any and all questions have been answered to the best of my abilities.    Yearly follow up will be coordinated by Burgess Estelle, Thoracic Navigator.  Time Total: 15 minutes  Visit consisted of counseling and education dealing with complex health screening. Greater than 50%  of this time was spent counseling and coordinating care related to the above assessment and plan.  Signed by: Altha Harm, PhD, NP-C [phone] (Work Cell)

## 2019-04-27 ENCOUNTER — Ambulatory Visit
Admission: RE | Admit: 2019-04-27 | Discharge: 2019-04-27 | Disposition: A | Discharge status: Home / Self Care | Payer: Commercial Managed Care - PPO | Source: Ambulatory Visit | Attending: Oncology | Admitting: Oncology

## 2019-04-27 ENCOUNTER — Other Ambulatory Visit: Payer: Self-pay

## 2019-04-27 DIAGNOSIS — Z87891 Personal history of nicotine dependence: Secondary | ICD-10-CM | POA: Diagnosis present

## 2019-05-02 ENCOUNTER — Encounter: Payer: Self-pay | Admitting: *Deleted

## 2020-04-04 ENCOUNTER — Telehealth: Payer: Self-pay

## 2020-04-04 NOTE — Telephone Encounter (Signed)
Attempted to call patient for annual lung screening. No answer and no voicemail available.

## 2020-04-22 ENCOUNTER — Telehealth: Payer: Self-pay | Admitting: *Deleted

## 2020-04-22 DIAGNOSIS — Z87891 Personal history of nicotine dependence: Secondary | ICD-10-CM

## 2020-04-22 DIAGNOSIS — Z122 Encounter for screening for malignant neoplasm of respiratory organs: Secondary | ICD-10-CM

## 2020-04-22 NOTE — Telephone Encounter (Signed)
Spoke to patient via telephone and confirmed smoking history. Former smoker, quit 2012, 1.5 ppd x 30 years. Annual lung screening ct scan scheduled for 07/10/20 @ 11:35 am.

## 2020-07-10 ENCOUNTER — Ambulatory Visit
Admission: RE | Admit: 2020-07-10 | Discharge: 2020-07-10 | Disposition: A | Discharge status: Home / Self Care | Payer: Commercial Managed Care - PPO | Source: Ambulatory Visit | Attending: Oncology | Admitting: Oncology

## 2020-07-10 ENCOUNTER — Other Ambulatory Visit: Payer: Self-pay

## 2020-07-10 DIAGNOSIS — Z122 Encounter for screening for malignant neoplasm of respiratory organs: Secondary | ICD-10-CM | POA: Diagnosis present

## 2020-07-10 DIAGNOSIS — Z87891 Personal history of nicotine dependence: Secondary | ICD-10-CM | POA: Insufficient documentation

## 2020-07-24 ENCOUNTER — Encounter: Payer: Self-pay | Admitting: *Deleted

## 2021-04-07 IMAGING — CT CT CHEST LUNG CANCER SCREENING LOW DOSE W/O CM
2 of 5 series · 15 of 40 positions shown, 18 images · non-contrast
Comparison: No priors.

CLINICAL DATA: 58-year-old male former smoker (quit 9 years ago)
with 45 pack-year history of smoking. Lung cancer screening
examination.

EXAM:
CT CHEST WITHOUT CONTRAST LOW-DOSE FOR LUNG CANCER SCREENING
TECHNIQUE: Multidetector CT imaging of the chest was performed following the
standard protocol without IV contrast.

[Series 3: lung 1.00 · axial · 0.75mm/px · z∈[-1237,-922]mm · 12 of 349 slices shown, 15 images]
[im 17/349  mediastinal]
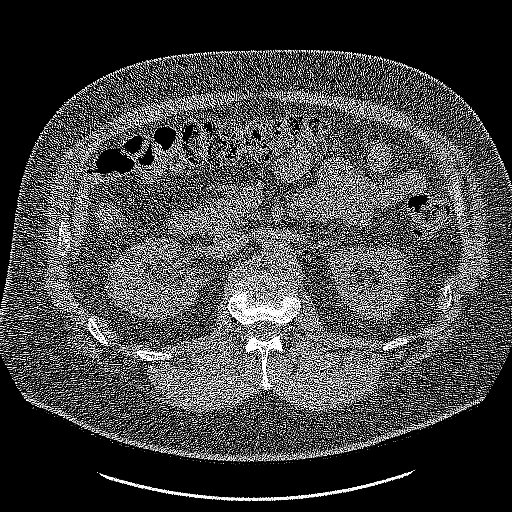
[im 17/349  lung]
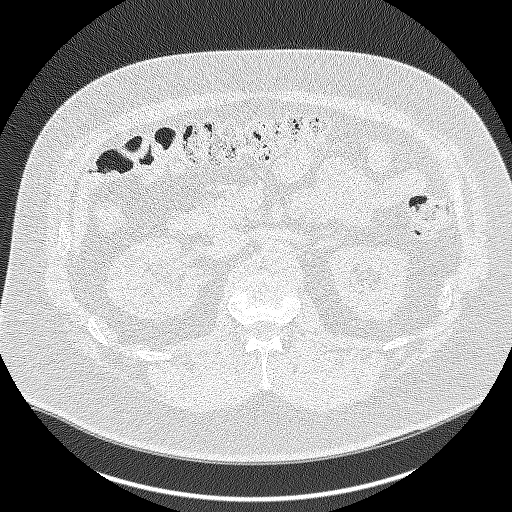
[im 50/349  lung]
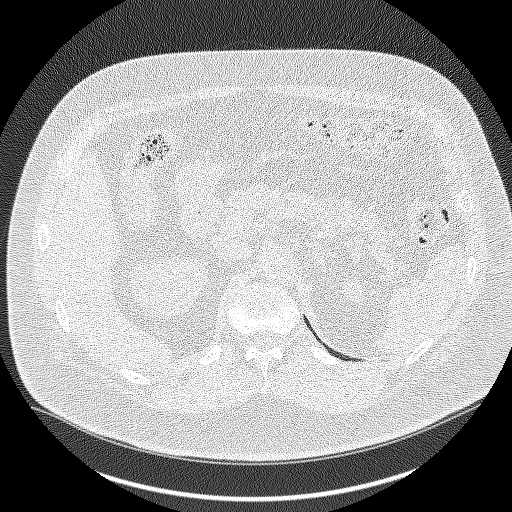
[im 83/349  lung]
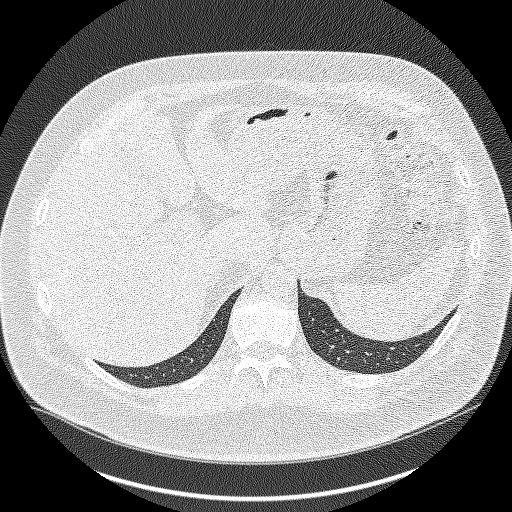
[im 100/349  lung]
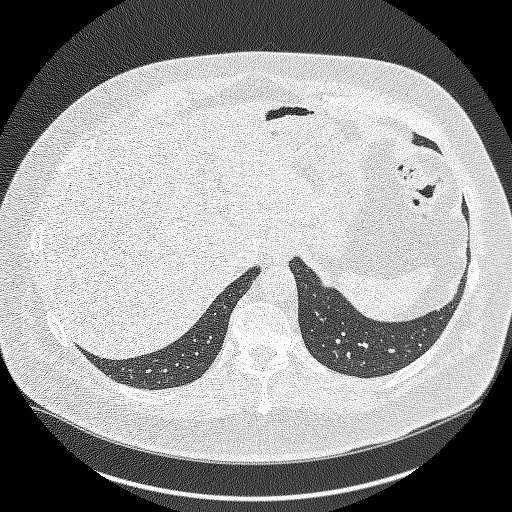
[im 133/349  mediastinal]
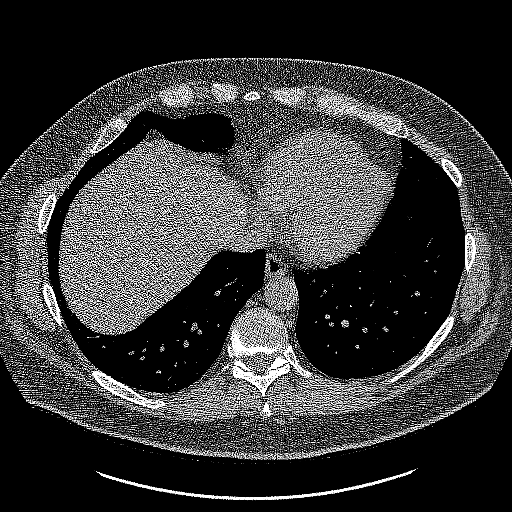
[im 133/349  lung]
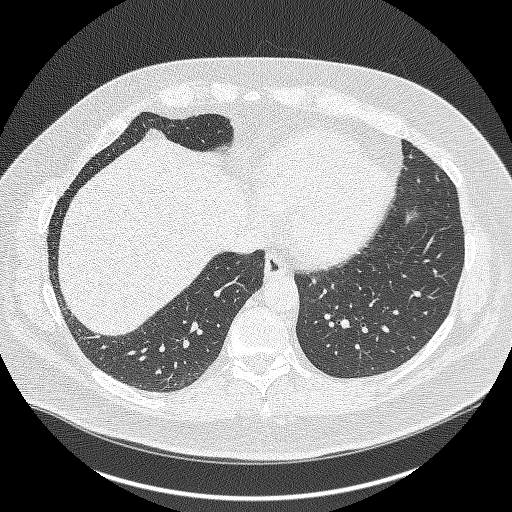
[im 166/349  lung]
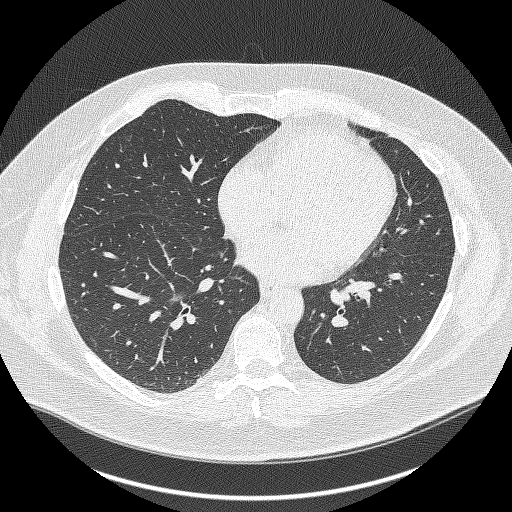
[im 183/349  lung]
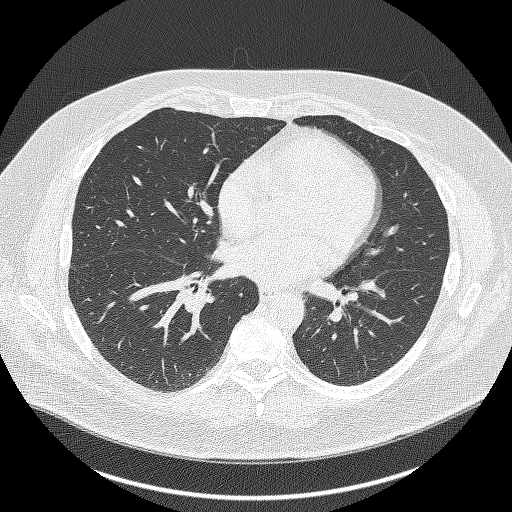
[im 216/349  lung]
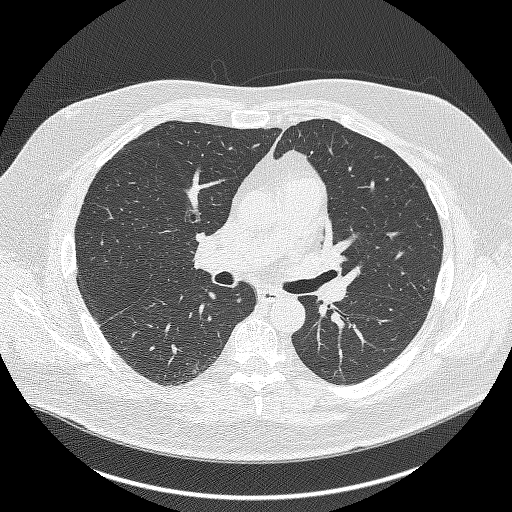
[im 249/349  mediastinal]
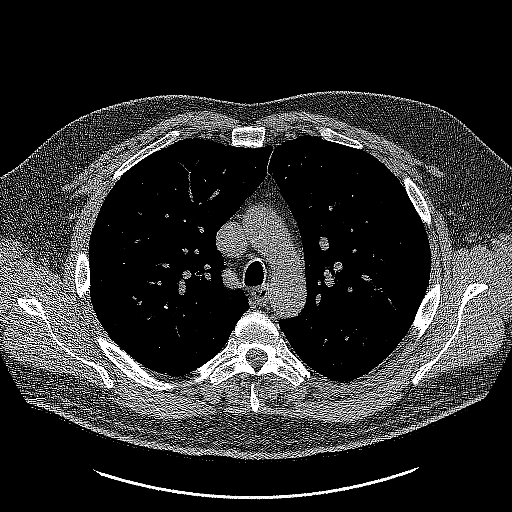
[im 249/349  lung]
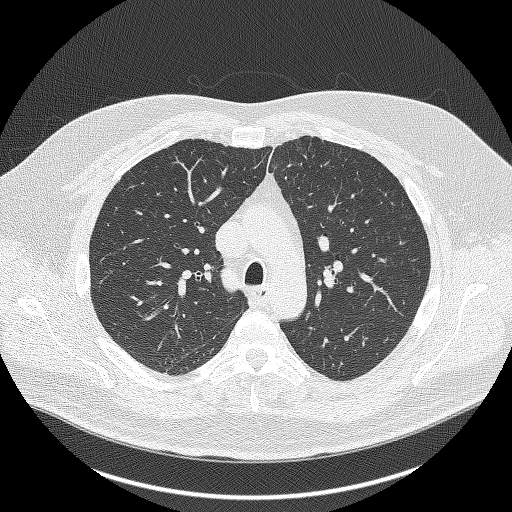
[im 266/349  lung]
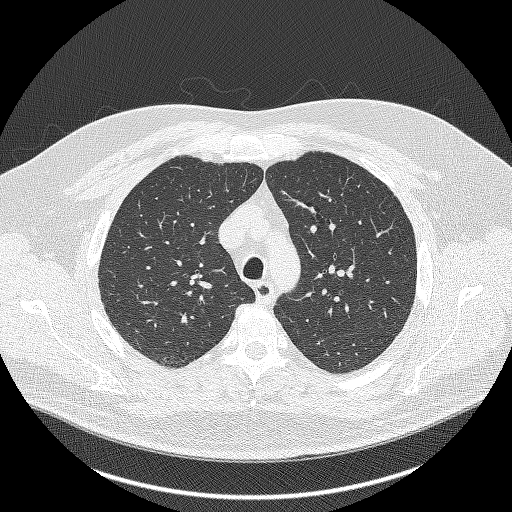
[im 299/349  lung]
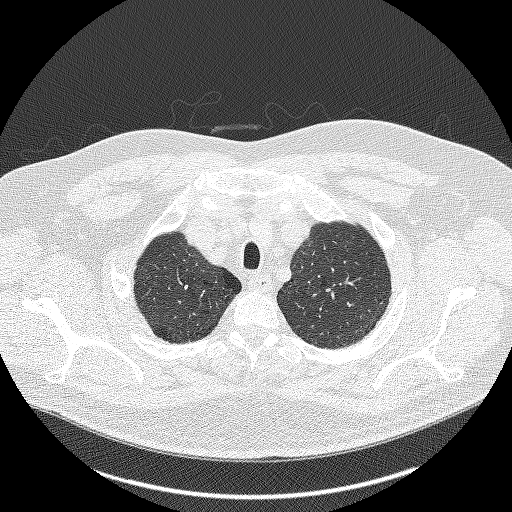
[im 332/349  lung]
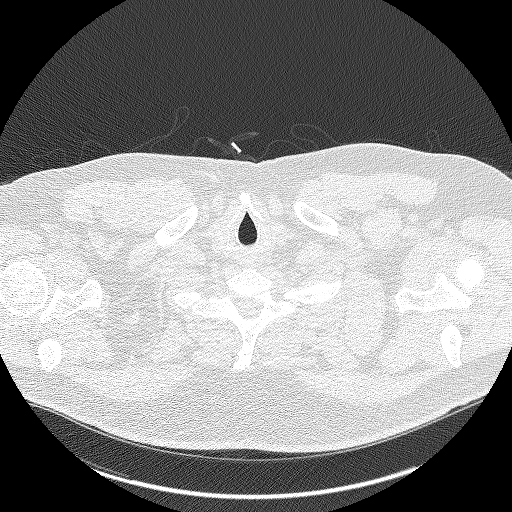

[Series 4: coronals lung 1.00 cor · coronal · 0.68mm/px · 3 of 312 slices shown]
[im 63/312  lung]
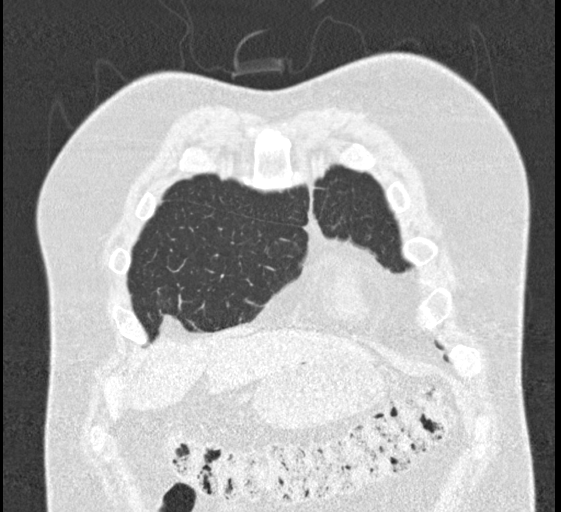
[im 125/312  lung]
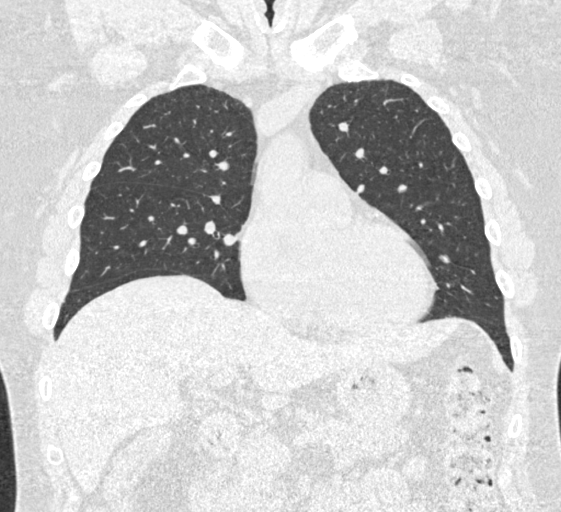
[im 187/312  lung]
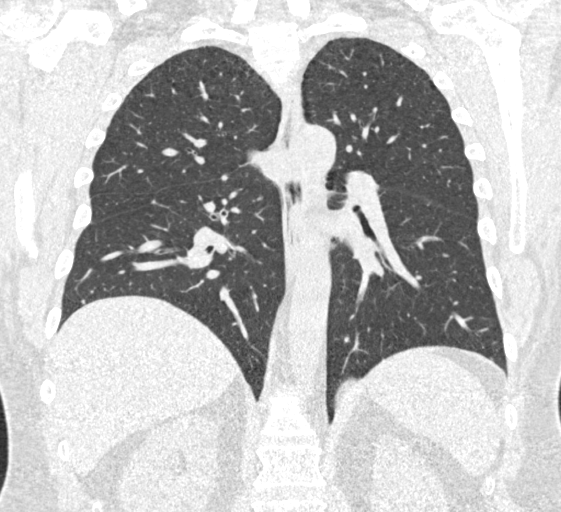

[15 of 40 positions shown; findings below may reference images not displayed]

FINDINGS: Cardiovascular: Heart size is normal. There is no significant
pericardial fluid, thickening or pericardial calcification. There is
aortic atherosclerosis, as well as atherosclerosis of the great
vessels of the mediastinum and the coronary arteries, including
calcified atherosclerotic plaque in the left anterior descending,
left circumflex and right coronary arteries.

Mediastinum/Nodes: No pathologically enlarged mediastinal or hilar
lymph nodes. Please note that accurate exclusion of hilar adenopathy
is limited on noncontrast CT scans. Esophagus is unremarkable in
appearance. No axillary lymphadenopathy.

Lungs/Pleura: Small pulmonary nodules are noted in the superior
segment of the right lower lobe (axial image 141 of series 3), with
a volume derived mean diameter of 3.6 mm. No other larger more
suspicious appearing pulmonary nodules or masses are noted. No acute
consolidative airspace disease. No pleural effusions. Mild diffuse
bronchial wall thickening with mild centrilobular and paraseptal
emphysema.

Upper Abdomen: Unremarkable.

Musculoskeletal: There are no aggressive appearing lytic or blastic
lesions noted in the visualized portions of the skeleton.
IMPRESSION: 1. Lung-RADS 2S, benign appearance or behavior. Continue annual
screening with low-dose chest CT without contrast in 12 months.
2. The "S" modifier above refers to potentially clinically
significant non lung cancer related findings. Specifically, there is
aortic atherosclerosis, in addition to 3 vessel coronary artery
disease. Please note that although the presence of coronary artery
calcium documents the presence of coronary artery disease, the
severity of this disease and any potential stenosis cannot be
assessed on this non-gated CT examination. Assessment for potential
risk factor modification, dietary therapy or pharmacologic therapy
may be warranted, if clinically indicated.
3. Mild diffuse bronchial wall thickening with mild centrilobular
and paraseptal emphysema; imaging findings suggestive of underlying
COPD.

Aortic Atherosclerosis (43J1I-DQJ.J) and Emphysema (43J1I-698.X).

## 2021-09-16 ENCOUNTER — Other Ambulatory Visit: Payer: Self-pay

## 2021-09-16 DIAGNOSIS — Z122 Encounter for screening for malignant neoplasm of respiratory organs: Secondary | ICD-10-CM

## 2021-09-16 DIAGNOSIS — Z87891 Personal history of nicotine dependence: Secondary | ICD-10-CM

## 2021-09-29 ENCOUNTER — Ambulatory Visit: Payer: Commercial Managed Care - PPO

## 2021-10-06 ENCOUNTER — Ambulatory Visit
Admission: RE | Admit: 2021-10-06 | Discharge: 2021-10-06 | Disposition: A | Discharge status: Home / Self Care | Payer: Commercial Managed Care - PPO | Source: Ambulatory Visit | Attending: Acute Care | Admitting: Acute Care

## 2021-10-06 DIAGNOSIS — Z122 Encounter for screening for malignant neoplasm of respiratory organs: Secondary | ICD-10-CM | POA: Insufficient documentation

## 2021-10-06 DIAGNOSIS — Z87891 Personal history of nicotine dependence: Secondary | ICD-10-CM | POA: Insufficient documentation

## 2021-10-08 ENCOUNTER — Other Ambulatory Visit: Payer: Self-pay

## 2021-10-08 DIAGNOSIS — Z87891 Personal history of nicotine dependence: Secondary | ICD-10-CM

## 2021-10-08 DIAGNOSIS — Z122 Encounter for screening for malignant neoplasm of respiratory organs: Secondary | ICD-10-CM

## 2022-04-13 ENCOUNTER — Ambulatory Visit (INDEPENDENT_AMBULATORY_CARE_PROVIDER_SITE_OTHER): Payer: Commercial Managed Care - PPO

## 2022-04-13 DIAGNOSIS — D125 Benign neoplasm of sigmoid colon: Secondary | ICD-10-CM | POA: Diagnosis not present

## 2022-04-13 DIAGNOSIS — K640 First degree hemorrhoids: Secondary | ICD-10-CM | POA: Diagnosis not present

## 2022-04-13 DIAGNOSIS — Z86010 Personal history of colonic polyps: Secondary | ICD-10-CM | POA: Diagnosis present

## 2022-04-13 DIAGNOSIS — K5730 Diverticulosis of large intestine without perforation or abscess without bleeding: Secondary | ICD-10-CM | POA: Diagnosis not present

## 2022-10-08 ENCOUNTER — Ambulatory Visit
Admission: RE | Admit: 2022-10-08 | Discharge: 2022-10-08 | Disposition: A | Discharge status: Home / Self Care | Payer: Commercial Managed Care - PPO | Source: Ambulatory Visit | Attending: Family Medicine | Admitting: Family Medicine

## 2022-10-08 DIAGNOSIS — Z122 Encounter for screening for malignant neoplasm of respiratory organs: Secondary | ICD-10-CM | POA: Insufficient documentation

## 2022-10-08 DIAGNOSIS — Z87891 Personal history of nicotine dependence: Secondary | ICD-10-CM | POA: Diagnosis present

## 2022-10-26 ENCOUNTER — Other Ambulatory Visit: Payer: Self-pay | Admitting: Acute Care

## 2022-10-26 DIAGNOSIS — Z87891 Personal history of nicotine dependence: Secondary | ICD-10-CM

## 2022-10-26 DIAGNOSIS — Z122 Encounter for screening for malignant neoplasm of respiratory organs: Secondary | ICD-10-CM

## 2023-10-18 ENCOUNTER — Other Ambulatory Visit: Payer: Self-pay | Admitting: Acute Care

## 2023-10-18 DIAGNOSIS — Z87891 Personal history of nicotine dependence: Secondary | ICD-10-CM

## 2023-10-18 DIAGNOSIS — Z122 Encounter for screening for malignant neoplasm of respiratory organs: Secondary | ICD-10-CM

## 2023-11-16 ENCOUNTER — Other Ambulatory Visit: Payer: Self-pay | Admitting: Cardiology

## 2023-11-16 DIAGNOSIS — R0609 Other forms of dyspnea: Secondary | ICD-10-CM

## 2023-11-16 DIAGNOSIS — I700 Atherosclerosis of aorta: Secondary | ICD-10-CM

## 2023-11-16 DIAGNOSIS — R7303 Prediabetes: Secondary | ICD-10-CM

## 2023-11-16 DIAGNOSIS — E7800 Pure hypercholesterolemia, unspecified: Secondary | ICD-10-CM

## 2023-11-16 DIAGNOSIS — I2510 Atherosclerotic heart disease of native coronary artery without angina pectoris: Secondary | ICD-10-CM

## 2023-12-03 ENCOUNTER — Telehealth (HOSPITAL_COMMUNITY): Payer: Self-pay | Admitting: *Deleted

## 2023-12-03 NOTE — Telephone Encounter (Signed)
 Reaching out to patient to offer assistance regarding upcoming cardiac imaging study; pt verbalizes understanding of appt date/time, parking situation and where to check in, pre-test NPO status and medications ordered, and verified current allergies; name and call back number provided for further questions should they arise  Larey Brick RN Navigator Cardiac Imaging [facility] [phone] office [phone] cell  Patient to take 50mg  metoprolol tartrate two hours prior to his cardiac CT scan.

## 2023-12-06 ENCOUNTER — Other Ambulatory Visit: Payer: Self-pay | Admitting: Cardiology

## 2023-12-06 ENCOUNTER — Ambulatory Visit
Admission: RE | Admit: 2023-12-06 | Discharge: 2023-12-06 | Disposition: A | Source: Ambulatory Visit | Attending: Cardiology

## 2023-12-06 DIAGNOSIS — I700 Atherosclerosis of aorta: Secondary | ICD-10-CM | POA: Diagnosis present

## 2023-12-06 DIAGNOSIS — I25118 Atherosclerotic heart disease of native coronary artery with other forms of angina pectoris: Secondary | ICD-10-CM

## 2023-12-06 DIAGNOSIS — I2584 Coronary atherosclerosis due to calcified coronary lesion: Secondary | ICD-10-CM | POA: Insufficient documentation

## 2023-12-06 DIAGNOSIS — I2510 Atherosclerotic heart disease of native coronary artery without angina pectoris: Secondary | ICD-10-CM | POA: Diagnosis present

## 2023-12-06 DIAGNOSIS — R0609 Other forms of dyspnea: Secondary | ICD-10-CM | POA: Insufficient documentation

## 2023-12-06 DIAGNOSIS — E7800 Pure hypercholesterolemia, unspecified: Secondary | ICD-10-CM | POA: Insufficient documentation

## 2023-12-06 DIAGNOSIS — R7303 Prediabetes: Secondary | ICD-10-CM | POA: Diagnosis present

## 2023-12-06 MED ORDER — IOHEXOL 350 MG/ML SOLN
100.0000 mL | Freq: Once | INTRAVENOUS | Status: AC | PRN
  Administered 2023-12-06: 100 mL via INTRAVENOUS

## 2023-12-06 MED ORDER — DILTIAZEM HCL 25 MG/5ML IV SOLN
10.0000 mg | INTRAVENOUS | Status: DC | PRN
  Filled 2023-12-06: qty 5

## 2023-12-06 MED ORDER — METOPROLOL TARTRATE 5 MG/5ML IV SOLN
10.0000 mg | Freq: Once | INTRAVENOUS | Status: DC | PRN
  Filled 2023-12-06: qty 10

## 2023-12-06 MED ORDER — NITROGLYCERIN 0.4 MG SL SUBL
0.8000 mg | SUBLINGUAL_TABLET | Freq: Once | SUBLINGUAL | Status: AC
  Administered 2023-12-06: 0.8 mg via SUBLINGUAL
  Filled 2023-12-06: qty 25
